# Patient Record
Sex: Male | Born: 1970 | Race: White | Hispanic: No | Marital: Married | Smoking: Current every day smoker
Health system: Southern US, Community
[De-identification: ages and names within clinical notes are randomized; demographics above are authoritative.]

## PROBLEM LIST (undated history)

## (undated) HISTORY — PX: HERNIA REPAIR: SHX51

---

## 2019-10-03 ENCOUNTER — Emergency Department (HOSPITAL_BASED_OUTPATIENT_CLINIC_OR_DEPARTMENT_OTHER): Payer: Self-pay

## 2019-10-03 ENCOUNTER — Other Ambulatory Visit: Payer: Self-pay

## 2019-10-03 ENCOUNTER — Encounter (HOSPITAL_BASED_OUTPATIENT_CLINIC_OR_DEPARTMENT_OTHER): Payer: Self-pay

## 2019-10-03 ENCOUNTER — Emergency Department (HOSPITAL_BASED_OUTPATIENT_CLINIC_OR_DEPARTMENT_OTHER)
Admission: EM | Admit: 2019-10-03 | Discharge: 2019-10-03 | Disposition: A | Discharge status: Home / Self Care | Payer: Self-pay | Attending: Emergency Medicine | Admitting: Emergency Medicine

## 2019-10-03 DIAGNOSIS — M25562 Pain in left knee: Secondary | ICD-10-CM | POA: Insufficient documentation

## 2019-10-03 DIAGNOSIS — F17210 Nicotine dependence, cigarettes, uncomplicated: Secondary | ICD-10-CM | POA: Insufficient documentation

## 2019-10-03 NOTE — ED Triage Notes (Signed)
Pt arrives ambulatory to ED with c/o pain in left knee states that it started with left foot pain about 1 month ago.

## 2019-10-03 NOTE — ED Notes (Signed)
ED Provider at bedside. 

## 2019-10-03 NOTE — Discharge Instructions (Signed)
Your ultrasound was negative for DVT.  He denies a blood clot in your leg.  There is a abnormality on your knee x-ray that shows a slight irregularity on the cartilage of your knee.  Please follow-up with an orthopedic doctor.  I have provided you with the office phone number for [facility] in [place] which I would recommend.  In the meantime please take Tylenol 1000 mg every 6 hours.  Please take this regularly as it will help with the pain and inflammation.  Please follow-up with a primary care doctor as well to recheck your blood pressure.

## 2019-10-03 NOTE — ED Notes (Signed)
Patient transported to Ultrasound 

## 2019-10-03 NOTE — ED Provider Notes (Signed)
[facility] EMERGENCY DEPARTMENT Provider Note   CSN: [id number] Arrival date & time: 10/03/19  7322     History Chief Complaint  Patient presents with  . Knee Pain    Richard Kane is a 49 y.o. male.  HPI Patient is a 49 year old male with no past medical history also notably has not seen a primary care doctor in many years.  Presented today for left knee and lower leg pain for approximately 1 month.  He states he was seen 6/10-2 months ago-at [facility] hospital and the possibility of getting a lower extremity ultrasound to rule out DVT was discussed however he states that he was in a rush and left without having ultrasound done.  On my review of EMR it appears this was a shared decision-making conversation with PA at the time.  Patient did have a well score of 0.  Patient states he has had intermittent swelling in his left leg and both his foot and calf and knee intermittently since.  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  He has no history of DVT, denies any active cancer, no chest pain or hemoptysis.  He states he does cough occasionally he is been a pack-a-day smoker for many years.  Denies any fevers, chills, recent long travel, hormone replacement therapy, trauma.      History reviewed. No pertinent past medical history.  There are no problems to display for this patient.   Past Surgical History:  Procedure Laterality Date  . HERNIA REPAIR         No family history on file.  Social History   Tobacco Use  . Smoking status: Current Every Day Smoker    Packs/day: 1.00    Types: Cigarettes  . Smokeless tobacco: Never Used  Vaping Use  . Vaping Use: Never used  Substance Use Topics  . Alcohol use: Never  . Drug use: Yes    Types: Methamphetamines    Comment: "couple of days ago"     Home Medications Prior to Admission medications   Not on File    Allergies    Patient has no known allergies.  Review of Systems   Review of Systems  Constitutional: Negative for chills and fever.  HENT: Negative for congestion.   Respiratory: Negative for shortness of breath.   Cardiovascular: Negative for chest pain.  Gastrointestinal: Negative for abdominal pain.  Musculoskeletal: Negative for neck pain.       Knee pain left     Physical Exam Updated Vital Signs BP (!) 129/96 (BP Location: Right Arm)   Pulse 75   Temp 97.7 F (36.5 C) (Oral)   Resp 16   Ht 5\' 11"  (1.803 m)   Wt 113.4 kg   SpO2 97%   BMI 34.87 kg/m   Physical Exam Vitals and nursing note reviewed.  Constitutional:      General: He is not in acute distress.    Appearance: Normal appearance. He is not ill-appearing.  HENT:     Head: Normocephalic and atraumatic.     Mouth/Throat:     Mouth: Mucous membranes are moist.  Eyes:     General: No scleral icterus.       Right eye: No discharge.        Left eye: No discharge.  Conjunctiva/sclera: Conjunctivae normal.  Cardiovascular:     Rate and Rhythm: Normal rate.     Comments: DP and PT pulses 3+ and symmetric Pulmonary:     Effort: Pulmonary effort is normal.     Breath sounds: No stridor.  Abdominal:     Tenderness: There is no abdominal tenderness.  Musculoskeletal:     Cervical back: Normal range of motion and neck supple.     Comments: No significant tenderness to outpatient of the left knee.  Mild calf tenderness approximately 6 cm below the popliteal fossa.  Very mild left knee swelling with faint warmth to touch but no redness.  Full range of motion of knee and strength 5/5 in bilateral extremities.  Gait is normal.  Skin:    General: Skin is warm and dry.     Capillary Refill: Capillary refill takes less than 2 seconds.  Neurological:     Mental Status: He is alert and oriented to person, place, and time. Mental status is at  baseline.     Comments: Sensation intact bilateral lower extremities     ED Results / Procedures / Treatments   Labs (all labs ordered are listed, but only abnormal results are displayed) Labs Reviewed - No data to display  EKG None  Radiology US Venous Img Lower  Left (DVT Study)  Result Date: 10/03/2019 CLINICAL DATA:  Calf pain swelling on the left EXAM: LEFT LOWER EXTREMITY VENOUS DOPPLER ULTRASOUND TECHNIQUE: Gray-scale sonography with graded compression, as well as color Doppler and duplex ultrasound were performed to evaluate the lower extremity deep venous systems from the level of the common femoral vein and including the common femoral, femoral, profunda femoral, popliteal and calf veins including the posterior tibial, peroneal and gastrocnemius veins when visible. The superficial great saphenous vein was also interrogated. Spectral Doppler was utilized to evaluate flow at rest and with distal augmentation maneuvers in the common femoral, femoral and popliteal veins. COMPARISON:  None. FINDINGS: Contralateral Common Femoral Vein: Respiratory phasicity is normal and symmetric with the symptomatic side. No evidence of thrombus. Normal compressibility. Common Femoral Vein: No evidence of thrombus. Normal compressibility, respiratory phasicity and response to augmentation. Saphenofemoral Junction: No evidence of thrombus. Normal compressibility and flow on color Doppler imaging. Profunda Femoral Vein: No evidence of thrombus. Normal compressibility and flow on color Doppler imaging. Femoral Vein: No evidence of thrombus. Normal compressibility, respiratory phasicity and response to augmentation. Popliteal Vein: No evidence of thrombus. Normal compressibility, respiratory phasicity and response to augmentation. Calf Veins: No evidence of thrombus. Normal compressibility and flow on color Doppler imaging. Superficial Great Saphenous Vein: No evidence of thrombus. Normal compressibility. Venous  Reflux:  None. Other Findings:  None. IMPRESSION: No evidence of deep venous thrombosis. Electronically Signed   By: Alcide Clever M.D.   On: 10/03/2019 11:35   DG Knee Complete 4 Views Left  Result Date: 10/03/2019 CLINICAL DATA:  Left knee pain and swelling, no known injury, initial encounter EXAM: LEFT KNEE - COMPLETE 4+ VIEW COMPARISON:  08/03/2019 FINDINGS: No acute fracture or dislocation is noted. Mild lucency is noted in the lateral femoral condyle slightly more prominent than that seen on the prior exam. Possibility of impending osteochondral defect deserves consideration. MRI may be helpful for further evaluation as clinically indicated. IMPRESSION: No acute fracture noted. Lucency in the lateral femoral condyle which may represent an impending osteochondral defect. MRI may be helpful as clinically indicated. Electronically Signed   By: Alcide Clever M.D.   On: 10/03/2019  11:49    Procedures Procedures (including critical care time)  Medications Ordered in ED Medications - No data to display  ED Course  I have reviewed the triage vital signs and the nursing notes.  Pertinent labs & imaging results that were available during my care of the patient were reviewed by me and considered in my medical decision making (see chart for details).    MDM Rules/Calculators/A&P                          Patient is a 49 year old male with history of approximately 1 month of left knee pain.  He also has had some left leg swelling.  He was seen 2 weeks ago and discussed having a ultrasound at that time to rule out DVT but states that he requested not to have it at the time.  He is now more concerned as his pain is continued.  Physical exam is notable for some mild calf tenderness on left side as well as some mild swelling of the left knee and mild warmth.  No significant left knee tenderness and full range of motion of knee and strength is intact and good sensation and pulses distally.  This is  atraumatic knee pain I have low suspicion for fracture however will obtain x-ray to rule out significant effusion there is some swelling.  Low suspicion for septic arthritis given his vital signs, lack of systemic symptoms, full range of motion of knee strength and physical exam is reassuring.  Will obtain DVT study to rule out DVT as he is having some leg swelling and calf tenderness.  Left knee shows abnormal lucency with possible costochondral defect.  May be some questionable osteoarthritis.  Suspect that this is likely causing his knee swelling and pain.  DVT study is negative.  I discussed the results of all these with patient.  I recommended close follow-up with PCP because of his elevated blood pressure and to reevaluate his symptoms.  I also recommend Ortho follow-up.  Was given information for [facility].  He is agreeable to plan and will follow up closely.  He was given return precautions for symptoms of septic arthritis.   Final Clinical Impression(s) / ED Diagnoses Final diagnoses:  Acute pain of left knee    Rx / DC Orders ED Discharge Orders    None       Gailen Shelter, [place] 10/03/19 1226    Pricilla Loveless, MD 10/03/19 1424

## 2021-12-10 IMAGING — CR DG KNEE COMPLETE 4+V*L*
4 series · 4 of 4 positions shown · non-contrast
Comparison: 08/03/2019

CLINICAL DATA: Left knee pain and swelling, no known injury,
initial encounter

EXAM:
LEFT KNEE - COMPLETE 4+ VIEW

[t knee ap left *]
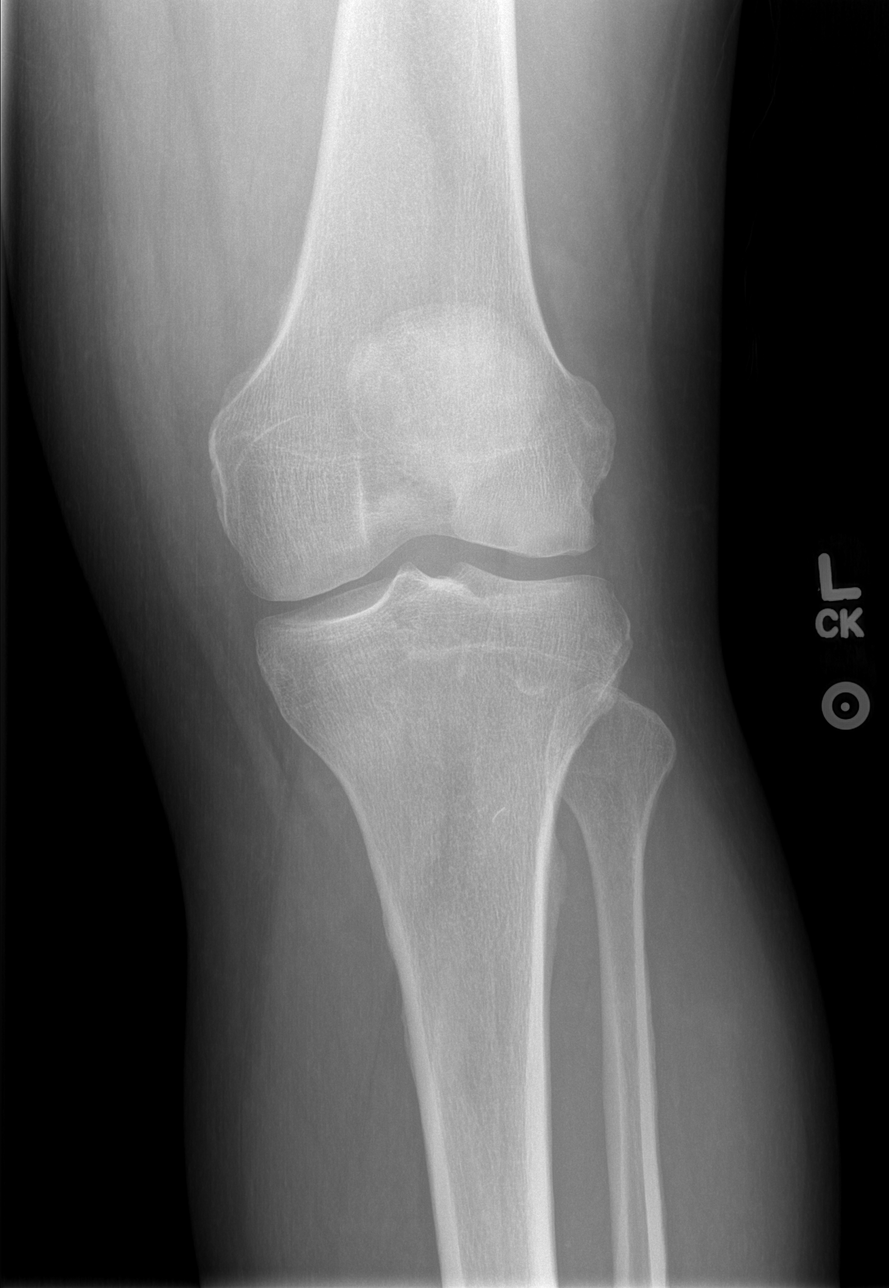

[t knee oblique left * (1 of 2)]
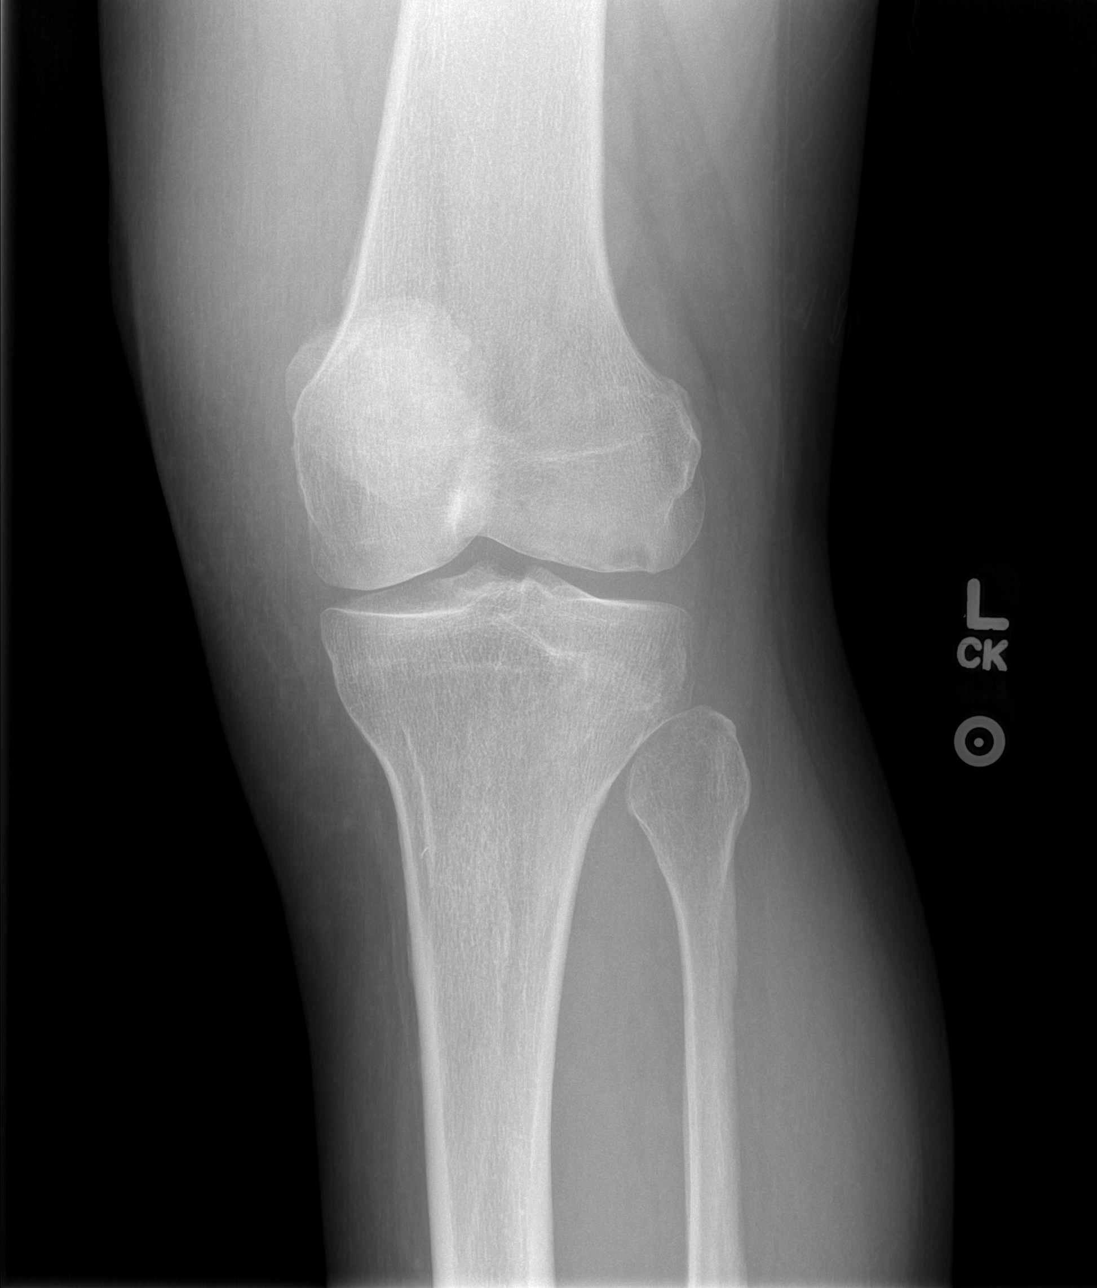

[t knee oblique left * (2 of 2)]
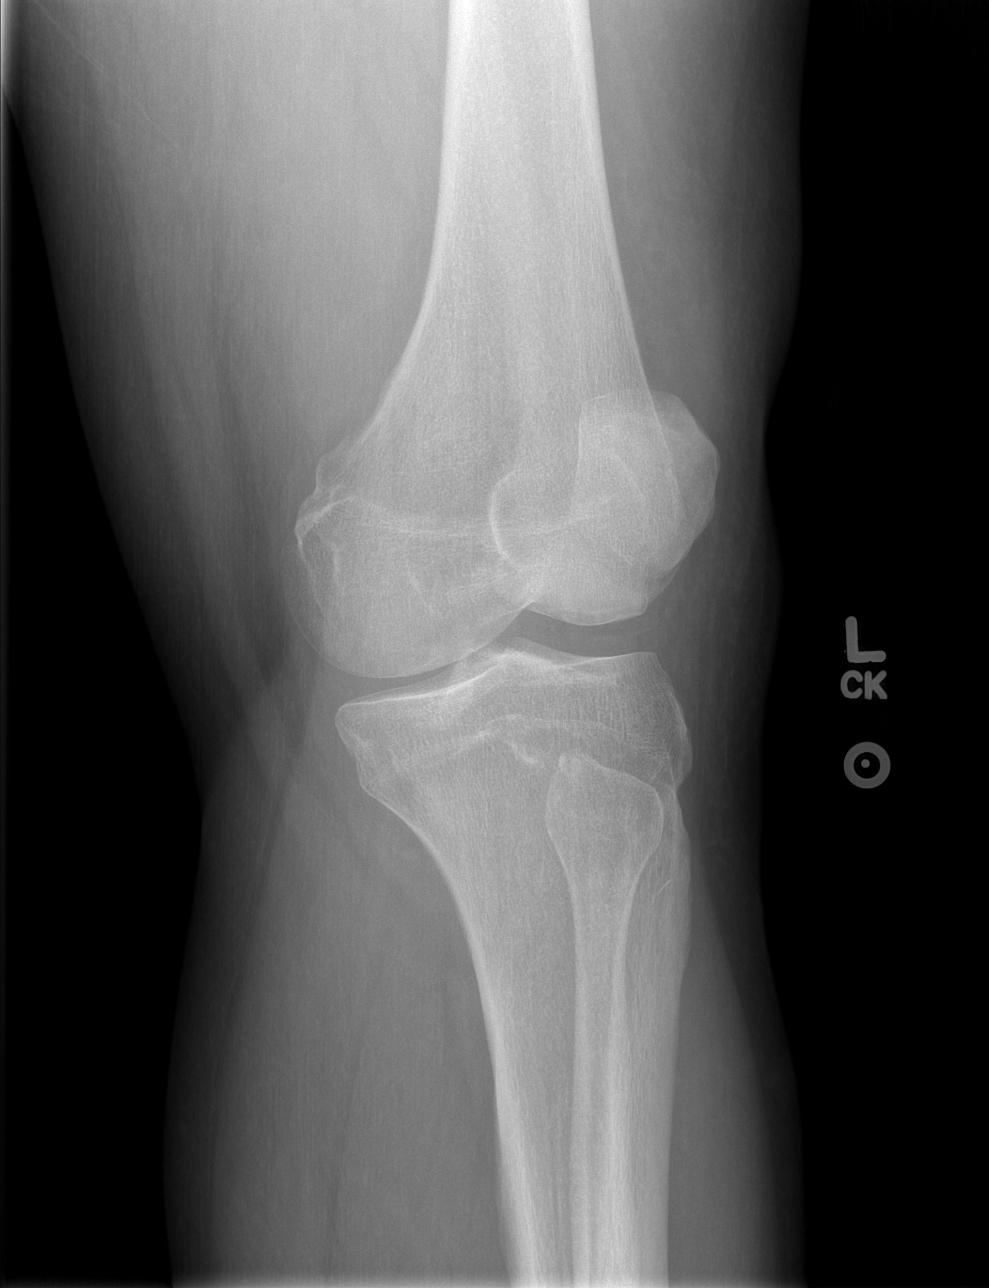

[t knee lat left *]
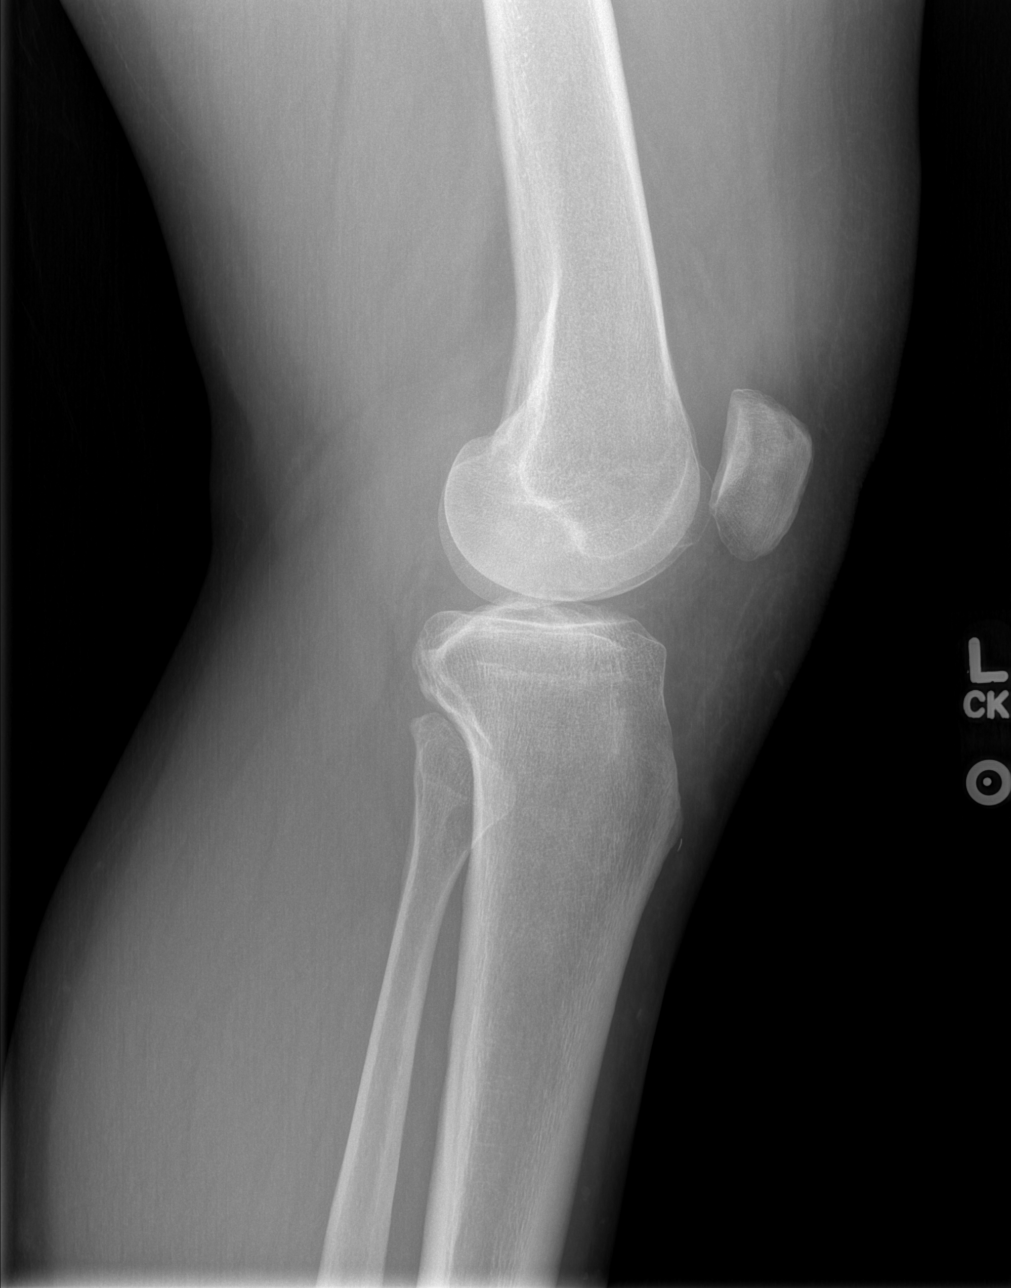

[4 of 4 positions shown; findings below may reference images not displayed]

FINDINGS: No acute fracture or dislocation is noted. Mild lucency is noted in
the lateral femoral condyle slightly more prominent than that seen
on the prior exam. Possibility of impending osteochondral defect
deserves consideration. MRI may be helpful for further evaluation as
clinically indicated.
IMPRESSION: No acute fracture noted.

Lucency in the lateral femoral condyle which may represent an
impending osteochondral defect. MRI may be helpful as clinically
indicated.
